# Patient Record
Sex: Male | Born: 1955 | Race: White | Hispanic: No | Marital: Married | Smoking: Former smoker
Health system: Southern US, Community
[De-identification: ages and names within clinical notes are randomized; demographics above are authoritative.]

## PROBLEM LIST (undated history)

## (undated) DIAGNOSIS — I10 Essential (primary) hypertension: Secondary | ICD-10-CM

## (undated) DIAGNOSIS — M109 Gout, unspecified: Secondary | ICD-10-CM

## (undated) DIAGNOSIS — M1990 Unspecified osteoarthritis, unspecified site: Secondary | ICD-10-CM

## (undated) DIAGNOSIS — Z87442 Personal history of urinary calculi: Secondary | ICD-10-CM

## (undated) DIAGNOSIS — K219 Gastro-esophageal reflux disease without esophagitis: Secondary | ICD-10-CM

## (undated) HISTORY — PX: ELBOW SURGERY: SHX618

## (undated) HISTORY — PX: ESOPHAGOGASTRODUODENOSCOPY: SHX1529

---

## 2009-05-05 ENCOUNTER — Ambulatory Visit: Payer: Self-pay | Admitting: Surgery

## 2013-01-06 ENCOUNTER — Ambulatory Visit: Payer: Self-pay | Admitting: Unknown Physician Specialty

## 2013-12-08 ENCOUNTER — Ambulatory Visit: Payer: Self-pay | Admitting: General Practice

## 2014-01-25 ENCOUNTER — Ambulatory Visit: Payer: Self-pay | Admitting: Surgery

## 2014-05-08 NOTE — Op Note (Signed)
PATIENT NAME:  [name], [name] MR#:  [id number] DATE OF BIRTH:  March 09, 1955  DATE OF PROCEDURE:  01/25/2014  PREOPERATIVE DIAGNOSIS: Degenerative joint disease with loose body, right elbow.   POSTOPERATIVE DIAGNOSIS:   Degenerative joint disease with loose body, right elbow.   PROCEDURE:   Extensive arthroscopic debridement with excision of multiple osteophytes and excision of loose body, right elbow.   SURGEON:  Maryagnes Amos, M.D.   ASSISTANT:  Devota Pace, NP.  ANESTHESIA:  General endotracheal.   FINDINGS: As noted above.   COMPLICATIONS: None.   ESTIMATED BLOOD LOSS: Minimal.   TOTAL FLUIDS: 800 mL of crystalloid.   TOURNIQUET TIME: 105 minutes at 250 mmHg.   DRAINS: None.   CLOSURE:  4-0 Prolene interrupted sutures.   BRIEF CLINICAL NOTE:  The patient is a 59 year old male with a long history of gradually  progressive pain and limited motion of his right elbow. His symptoms have gradually progressed despite medications, activity modification, steroid injection, etc.  His history and examination were consistent with significant degenerative joint disease of the elbow as confirmed by plain radiographs. A CT scan demonstrated the presence of at least one large loose body in the posterior fossa. He presents at this time for arthroscopy, debridement of osteophytes, and removal of a loose body.   PROCEDURE: The patient was brought into the Operating Room and lain in the supine position. After adequate general endotracheal intubation and anesthesia were obtained, the patient was repositioned in the left lateral decubitus position and secured using a beanbag. The right arm was draped over the arm holder and attached to the arm holder with Coban after applying the tourniquet. The right upper extremity was prepped with ChloraPrep solution before being draped sterilely. Preoperative antibiotics were administered. The elbow was carefully palpated and the appropriate portal sites marked  before the joint was inflated with approximately 25 mL of saline solution. The limb was exsanguinated with an Esmarch and the tourniquet inflated to 250 mmHg.   The anterolateral portal was created first just over the radial head. The cannula was advanced across the joint and punched out medially so that it tented the skin. A small stab incision was made here for the anteromedial portal. The disposable cannula was then guided into the joint over the initial cannula. The scope was inserted.  Extensive synovitis was debrided using the full radius resector before the ArthroCare wand was used to denude the periosteal tissue off the large osteophyte noted in the coronoid notch.  This large osteophyte was removed using the 4 mm round bur.   Once this was adequately resected, attention was directed to the coronoid process. This was denuded of soft tissues using the full radius resector ArthroCare wand before approximately 5 mm of the tip was removed using the round bur. As this was being resected, it was noted that there appeared to be a loose fragment, consistent with a symptomatic osteophyte. This was removed in its entirety. Once this was resected, the full radius resector was reintroduced to remove any residual bony debris. Using a switching stick, the camera was repositioned in the anteromedial portal and the instrumentation performed through the anterolateral portal. The full radius resector was used to remove additional synovial tissues to better expose the radial head and lateral portion of the joint. The ArthroCare wand was used to expose and delineate the spur over the anterior aspect of the humeral head in the radial fossa. The spur was removed using the 4 mm bur.  In  addition, this was extended to the midline further so as to be sure that the cornoid fossa was adequately debrided.  In addition, a little more of the coronoid process was resected as well. Again, the full radius resector was used to remove any  residual bony debris as well as to take down some thickened capsular tissue off the anterior aspect of the distal humerus, as well as along the anterior aspect of the coronoid process and the radial neck region. Care was taken to avoid debriding any of the annular ligaments. The instruments were then removed from the anterior aspect of the elbow after suctioning the excess fluid.   The posterior aspect of the elbow was addressed next. A posterolateral portal was created for placement of the arthroscope.  A posterior central portal also was created for instrumentation. The full radius resector was inserted and used to debride synovial tissues in order to improve visualization. This also was used to better define the olecranon tip, as well as the olecranon fossa.  A large osteophyte was noted in the olecranon fossa. This was better delineated using the ArthroCare wand before the 4 mm bur was used to remove the spur. Care was taken to remove some posterolateral osteophytes as well. The tip of the olecranon was then resected.  Of note, an approximately 5 x 8 mm loose body was identified in the posterolateral aspect of the elbow and removed using graspers.  Once adequate bony resection had been achieved, additional soft tissues were debrided using the full radius resector with care taken when debriding posterior medially.  A more extensive capsulectomy was performed in the posterior aspect of the distal humerus centrally and laterally, as well as further debridement of the posterolateral gutter region. The ArthroCare wand also was used to remove some thickened synovial and capsular tissues.   The instruments were removed from the posterior aspect of the elbow after suctioning the excess fluid. The portal sites were reapproximated using 4-0 Prolene interrupted sutures before a bulky dressing was applied to the elbow and the arm placed into a sling.   The patient was then rolled back in the supine position before he  was awakened, extubated, and returned to the recovery room in satisfactory condition after tolerating the procedure well.     ____________________________ J. Derald MacleodJeffrey Darivs Lunden, MD jjp:at D: 01/25/2014 12:16:33 ET T: 01/25/2014 12:45:38 ET JOB#: [id number]  cc: Maryagnes AmosJ. Jeffrey Mujtaba Bollig, MD, <Dictator> JEFF Fidel LevyJ Jaelen Gellerman MD ELECTRONICALLY SIGNED 01/25/2014 15:03

## 2016-01-15 ENCOUNTER — Encounter
Admission: RE | Admit: 2016-01-15 | Discharge: 2016-01-15 | Disposition: A | Discharge status: Home / Self Care | Payer: BLUE CROSS/BLUE SHIELD | Source: Ambulatory Visit | Attending: Surgery | Admitting: Surgery

## 2016-01-15 HISTORY — DX: Unspecified osteoarthritis, unspecified site: M19.90

## 2016-01-15 HISTORY — DX: Personal history of urinary calculi: Z87.442

## 2016-01-15 HISTORY — DX: Gastro-esophageal reflux disease without esophagitis: K21.9

## 2016-01-15 HISTORY — DX: Gout, unspecified: M10.9

## 2016-01-15 HISTORY — DX: Essential (primary) hypertension: I10

## 2016-01-15 NOTE — Patient Instructions (Signed)
  Your procedure is scheduled on: 01-23-16 Report to Same Day Surgery 2nd floor medical mall [facility](Medical Mall Entrance-take elevator on left to 2nd floor.  Check in with surgery information desk.) To find out your arrival time please call [phone] between 1PM - 3PM on 01-22-16  Remember: Instructions that are not followed completely may result in serious medical risk, up to and including death, or upon the discretion of your surgeon and anesthesiologist your surgery may need to be rescheduled.    _x___ 1. Do not eat food or drink liquids after midnight. No gum chewing or hard candies.     __x__ 2. No Alcohol for 24 hours before or after surgery.   __x__3. No Smoking for 24 prior to surgery.   ____  4. Bring all medications with you on the day of surgery if instructed.    __x__ 5. Notify your doctor if there is any change in your medical condition     (cold, fever, infections).     Do not wear jewelry, make-up, hairpins, clips or nail polish.  Do not wear lotions, powders, or perfumes. You may wear deodorant.  Do not shave 48 hours prior to surgery. Men may shave face and neck.  Do not bring valuables to the hospital.    [facility] is not [facility] for any belongings or valuables.               Contacts, dentures or bridgework may not be worn into surgery.  Leave your suitcase in the car. After surgery it may be brought to your room.  For patients admitted to the hospital, discharge time is determined by your treatment team.   Patients discharged the day of surgery will not be allowed to drive home.  You will need someone to drive you home and stay with you the night of your procedure.    Please read over the following fact sheets that you were given:   [facility] Preparing for [facility] and or MRSA Information   ____ Take these medicines the morning of surgery with A SIP OF WATER:    1. NONE  2.  3.  4.  5.  6.  ____Fleets enema or Magnesium Citrate as directed.   ____  Use CHG Soap or sage wipes as directed on instruction sheet   ____ Use inhalers on the day of surgery and bring to hospital day of surgery  ____ Stop metformin 2 days prior to surgery    ____ Take 1/2 of usual insulin dose the night before surgery and none on the morning of surgery.   ____ Stop Aspirin, Coumadin, Pllavix ,Eliquis, Effient, or Pradaxa  x__ Stop Anti-inflammatories such as Advil, Aleve, Ibuprofen, Motrin, Naproxen,          Naprosyn, Goodies powders or aspirin products NOW-Ok to take Tylenol.   ____ Stop supplements until after surgery.    ____ Bring C-Pap to the hospital.

## 2016-02-01 MED ORDER — CEFAZOLIN SODIUM-DEXTROSE 2-4 GM/100ML-% IV SOLN
2.0000 g | Freq: Once | INTRAVENOUS | Status: AC
  Administered 2016-02-02 (×2): 2 g via INTRAVENOUS

## 2016-02-02 ENCOUNTER — Ambulatory Visit: Payer: BLUE CROSS/BLUE SHIELD | Admitting: Anesthesiology

## 2016-02-02 ENCOUNTER — Encounter
Admission: RE | Disposition: A | Discharge status: Home / Self Care | Payer: Self-pay | Source: Ambulatory Visit | Attending: Surgery

## 2016-02-02 ENCOUNTER — Encounter: Payer: Self-pay | Admitting: *Deleted

## 2016-02-02 ENCOUNTER — Ambulatory Visit
Admission: RE | Admit: 2016-02-02 | Discharge: 2016-02-02 | Disposition: A | Discharge status: Home / Self Care | Payer: BLUE CROSS/BLUE SHIELD | Source: Ambulatory Visit | Attending: Surgery | Admitting: Surgery

## 2016-02-02 DIAGNOSIS — M1990 Unspecified osteoarthritis, unspecified site: Secondary | ICD-10-CM | POA: Insufficient documentation

## 2016-02-02 DIAGNOSIS — Z87442 Personal history of urinary calculi: Secondary | ICD-10-CM | POA: Diagnosis not present

## 2016-02-02 DIAGNOSIS — K219 Gastro-esophageal reflux disease without esophagitis: Secondary | ICD-10-CM | POA: Diagnosis not present

## 2016-02-02 DIAGNOSIS — M109 Gout, unspecified: Secondary | ICD-10-CM | POA: Insufficient documentation

## 2016-02-02 DIAGNOSIS — Z79899 Other long term (current) drug therapy: Secondary | ICD-10-CM | POA: Diagnosis not present

## 2016-02-02 DIAGNOSIS — I10 Essential (primary) hypertension: Secondary | ICD-10-CM | POA: Diagnosis not present

## 2016-02-02 DIAGNOSIS — Z87891 Personal history of nicotine dependence: Secondary | ICD-10-CM | POA: Insufficient documentation

## 2016-02-02 DIAGNOSIS — K439 Ventral hernia without obstruction or gangrene: Secondary | ICD-10-CM | POA: Insufficient documentation

## 2016-02-02 DIAGNOSIS — Z9889 Other specified postprocedural states: Secondary | ICD-10-CM | POA: Insufficient documentation

## 2016-02-02 HISTORY — PX: VENTRAL HERNIA REPAIR: SHX424

## 2016-02-02 SURGERY — REPAIR, HERNIA, VENTRAL
Anesthesia: General | Wound class: Clean Contaminated

## 2016-02-02 MED ORDER — SUCCINYLCHOLINE CHLORIDE 200 MG/10ML IV SOSY
PREFILLED_SYRINGE | INTRAVENOUS | Status: AC
  Filled 2016-02-02: qty 10

## 2016-02-02 MED ORDER — ONDANSETRON HCL 4 MG/2ML IJ SOLN
INTRAMUSCULAR | Status: AC
  Filled 2016-02-02: qty 2

## 2016-02-02 MED ORDER — LACTATED RINGERS IV SOLN
INTRAVENOUS | Status: DC
  Administered 2016-02-02: 08:00:00 via INTRAVENOUS

## 2016-02-02 MED ORDER — SEVOFLURANE IN SOLN
RESPIRATORY_TRACT | Status: AC
  Filled 2016-02-02: qty 250

## 2016-02-02 MED ORDER — BUPIVACAINE HCL (PF) 0.5 % IJ SOLN
INTRAMUSCULAR | Status: AC
  Filled 2016-02-02: qty 30

## 2016-02-02 MED ORDER — FAMOTIDINE 20 MG PO TABS
ORAL_TABLET | ORAL | Status: DC
Start: 2016-02-02 — End: 2016-02-02
  Filled 2016-02-02: qty 1

## 2016-02-02 MED ORDER — ROCURONIUM BROMIDE 100 MG/10ML IV SOLN
INTRAVENOUS | Status: DC | PRN
  Administered 2016-02-02: 50 mg via INTRAVENOUS

## 2016-02-02 MED ORDER — ROCURONIUM BROMIDE 50 MG/5ML IV SOSY
PREFILLED_SYRINGE | INTRAVENOUS | Status: AC
  Filled 2016-02-02: qty 5

## 2016-02-02 MED ORDER — DEXMEDETOMIDINE HCL IN NACL 200 MCG/50ML IV SOLN
INTRAVENOUS | Status: DC | PRN
  Administered 2016-02-02: 12 ug via INTRAVENOUS

## 2016-02-02 MED ORDER — EPINEPHRINE PF 1 MG/ML IJ SOLN
INTRAMUSCULAR | Status: AC
Start: 2016-02-02 — End: 2016-02-02
  Filled 2016-02-02: qty 1

## 2016-02-02 MED ORDER — OXYCODONE HCL 5 MG/5ML PO SOLN: 5.0000 mg | Freq: Once | ORAL | Status: DC | PRN

## 2016-02-02 MED ORDER — SUGAMMADEX SODIUM 200 MG/2ML IV SOLN
INTRAVENOUS | Status: DC | PRN
  Administered 2016-02-02: 200 mg via INTRAVENOUS

## 2016-02-02 MED ORDER — HYDROMORPHONE HCL 1 MG/ML IJ SOLN
INTRAMUSCULAR | Status: DC | PRN
  Administered 2016-02-02: 0.5 mg via INTRAVENOUS

## 2016-02-02 MED ORDER — HYDROCODONE-ACETAMINOPHEN 5-325 MG PO TABS: 1.0000 | ORAL_TABLET | ORAL | Status: DC | PRN

## 2016-02-02 MED ORDER — DEXMEDETOMIDINE HCL IN NACL 200 MCG/50ML IV SOLN
INTRAVENOUS | Status: AC
  Filled 2016-02-02: qty 50

## 2016-02-02 MED ORDER — ONDANSETRON HCL 4 MG/2ML IJ SOLN
INTRAMUSCULAR | Status: DC | PRN
  Administered 2016-02-02: 4 mg via INTRAVENOUS

## 2016-02-02 MED ORDER — OXYCODONE HCL 5 MG PO TABS: 5.0000 mg | ORAL_TABLET | Freq: Once | ORAL | Status: DC | PRN

## 2016-02-02 MED ORDER — MIDAZOLAM HCL 2 MG/2ML IJ SOLN
INTRAMUSCULAR | Status: AC
  Filled 2016-02-02: qty 2

## 2016-02-02 MED ORDER — SUGAMMADEX SODIUM 200 MG/2ML IV SOLN
INTRAVENOUS | Status: AC
  Filled 2016-02-02: qty 2

## 2016-02-02 MED ORDER — PROPOFOL 10 MG/ML IV BOLUS
INTRAVENOUS | Status: AC
  Filled 2016-02-02: qty 20

## 2016-02-02 MED ORDER — LIDOCAINE HCL (CARDIAC) 20 MG/ML IV SOLN
INTRAVENOUS | Status: DC | PRN
  Administered 2016-02-02: 100 mg via INTRAVENOUS

## 2016-02-02 MED ORDER — DEXAMETHASONE SODIUM PHOSPHATE 10 MG/ML IJ SOLN
INTRAMUSCULAR | Status: DC | PRN
  Administered 2016-02-02: 10 mg via INTRAVENOUS

## 2016-02-02 MED ORDER — PROPOFOL 10 MG/ML IV BOLUS
INTRAVENOUS | Status: DC | PRN
  Administered 2016-02-02: 180 mg via INTRAVENOUS

## 2016-02-02 MED ORDER — KETOROLAC TROMETHAMINE 30 MG/ML IJ SOLN
INTRAMUSCULAR | Status: AC
  Filled 2016-02-02: qty 1

## 2016-02-02 MED ORDER — CEFAZOLIN SODIUM-DEXTROSE 2-4 GM/100ML-% IV SOLN
INTRAVENOUS | Status: AC
  Filled 2016-02-02: qty 100

## 2016-02-02 MED ORDER — HYDROMORPHONE HCL 1 MG/ML IJ SOLN
INTRAMUSCULAR | Status: AC
  Filled 2016-02-02: qty 1

## 2016-02-02 MED ORDER — FENTANYL CITRATE (PF) 100 MCG/2ML IJ SOLN
INTRAMUSCULAR | Status: DC | PRN
  Administered 2016-02-02: 100 ug via INTRAVENOUS

## 2016-02-02 MED ORDER — FENTANYL CITRATE (PF) 100 MCG/2ML IJ SOLN: 25.0000 ug | INTRAMUSCULAR | Status: DC | PRN

## 2016-02-02 MED ORDER — FAMOTIDINE 20 MG PO TABS
20.0000 mg | ORAL_TABLET | Freq: Once | ORAL | Status: AC
  Administered 2016-02-02: 20 mg via ORAL

## 2016-02-02 MED ORDER — FENTANYL CITRATE (PF) 250 MCG/5ML IJ SOLN
INTRAMUSCULAR | Status: AC
  Filled 2016-02-02: qty 5

## 2016-02-02 MED ORDER — HYDROCODONE-ACETAMINOPHEN 5-325 MG PO TABS: 1.0000 | ORAL_TABLET | ORAL | 0 refills | Status: AC | PRN

## 2016-02-02 MED ORDER — DEXAMETHASONE SODIUM PHOSPHATE 10 MG/ML IJ SOLN
INTRAMUSCULAR | Status: AC
Start: 2016-02-02 — End: 2016-02-02
  Filled 2016-02-02: qty 1

## 2016-02-02 MED ORDER — MIDAZOLAM HCL 2 MG/2ML IJ SOLN
INTRAMUSCULAR | Status: DC | PRN
  Administered 2016-02-02: 2 mg via INTRAVENOUS

## 2016-02-02 MED ORDER — BUPIVACAINE-EPINEPHRINE (PF) 0.5% -1:200000 IJ SOLN
INTRAMUSCULAR | Status: DC | PRN
  Administered 2016-02-02: 12 mL via PERINEURAL

## 2016-02-02 SURGICAL SUPPLY — 31 items
BLADE CLIPPER SURG (BLADE) ×3 IMPLANT
CANISTER SUCT 1200ML W/VALVE (MISCELLANEOUS) ×3 IMPLANT
CHLORAPREP W/TINT 26ML (MISCELLANEOUS) ×3 IMPLANT
DERMABOND ADVANCED (GAUZE/BANDAGES/DRESSINGS) ×2
DERMABOND ADVANCED .7 DNX12 (GAUZE/BANDAGES/DRESSINGS) ×1 IMPLANT
DRAPE LAPAROTOMY 100X77 ABD (DRAPES) ×3 IMPLANT
ELECT REM PT RETURN 9FT ADLT (ELECTROSURGICAL) ×3
ELECTRODE REM PT RTRN 9FT ADLT (ELECTROSURGICAL) ×1 IMPLANT
GAUZE SPONGE 4X4 12PLY STRL (GAUZE/BANDAGES/DRESSINGS) IMPLANT
GLOVE BIO SURGEON STRL SZ 6 (GLOVE) ×6 IMPLANT
GLOVE BIO SURGEON STRL SZ7.5 (GLOVE) ×3 IMPLANT
GLOVE INDICATOR 6.5 STRL GRN (GLOVE) ×6 IMPLANT
GOWN STRL REUS W/ TWL LRG LVL3 (GOWN DISPOSABLE) ×4 IMPLANT
GOWN STRL REUS W/TWL LRG LVL3 (GOWN DISPOSABLE) ×8
KIT RM TURNOVER STRD PROC AR (KITS) ×3 IMPLANT
LABEL OR SOLS (LABEL) ×3 IMPLANT
MESH SYNTHETIC 4X6 SOFT BARD (Mesh General) ×1 IMPLANT
MESH SYNTHETIC SOFT BARD 4X6 (Mesh General) ×2 IMPLANT
NEEDLE FILTER BLUNT 18X 1/2SAF (NEEDLE) ×2
NEEDLE FILTER BLUNT 18X1 1/2 (NEEDLE) ×1 IMPLANT
NEEDLE HYPO 25X1 1.5 SAFETY (NEEDLE) ×3 IMPLANT
NS IRRIG 500ML POUR BTL (IV SOLUTION) ×3 IMPLANT
PACK BASIN MINOR ARMC (MISCELLANEOUS) ×3 IMPLANT
STAPLER SKIN PROX 35W (STAPLE) IMPLANT
SUT CHROMIC 3 0 SH 27 (SUTURE) ×3 IMPLANT
SUT MNCRL 4-0 (SUTURE) ×2
SUT MNCRL 4-0 27XMFL (SUTURE) ×1
SUT SURGILON 0 30 BLK (SUTURE) ×9 IMPLANT
SUTURE MNCRL 4-0 27XMF (SUTURE) ×1 IMPLANT
SYR TB 1ML 27GX1/2 LL (SYRINGE) ×3 IMPLANT
SYRINGE 10CC LL (SYRINGE) ×3 IMPLANT

## 2016-02-02 NOTE — H&P (Signed)
Jonathan ReaderCharles T Oconnell is an 61 y.o. male.   Chief Complaint: A bulge in the abdomen HPI: He reports a history of bulging in the epigastrium which dates back to childhood which in recent years has slowly increased in size. He has a minimal amount of discomfort at the site. He was seen and evaluated in the office and found to have a ventral hernia and repair was recommended for definitive treatment.    Past Medical History:  Diagnosis Date  . Arthritis   . GERD (gastroesophageal reflux disease)    occ-no meds  . Gout   . History of kidney stones   . Hypertension    h/o-pcp took pt off bp med in 2015-pt had lost weight and bp has remained under control    Past Surgical History:  Procedure Laterality Date  . ELBOW SURGERY Right   . ESOPHAGOGASTRODUODENOSCOPY      History reviewed. No pertinent family history. Social History:  reports that he quit smoking about 31 years ago. His smoking use included Cigarettes. He has a 10.00 pack-year smoking history. He has quit using smokeless tobacco. His smokeless tobacco use included Chew. He reports that he drinks alcohol. He reports that he does not use drugs.  Allergies: No Known Allergies  Medications Prior to Admission  Medication Sig Dispense Refill  . acetaminophen (TYLENOL) 325 MG tablet Take 650 mg by mouth every 6 (six) hours as needed (for pain.).    Marland Kitchen. allopurinol (ZYLOPRIM) 100 MG tablet Take 200 mg by mouth at bedtime.    Marland Kitchen. ibuprofen (ADVIL,MOTRIN) 200 MG tablet Take 200 mg by mouth every 6 (six) hours as needed.      No results found for this or any previous visit (from the past 48 hour(s)). No results found.  ROS he reports that about 2 weeks ago had a cough and cold which has resolved. He reports no more recent difficulty breathing. He reports no chest pains. He has been eating satisfactorily and moving his bowels satisfactorily and voiding satisfactorily. Review of systems otherwise negative.  Blood pressure 128/81, pulse 66,  temperature 97.8 F (36.6 C), temperature source Oral, resp. rate 16, weight 76.2 kg (168 lb), SpO2 100 %. Physical Exam  GENERAL:  Awake alert and oriented and in no acute distress.  HEENT:  Head is normocephalic.  Pupils are equal reactive to light.  Extraocular movements are intact. Sclera is clear.  Pharynx is clear.  NECK:  Supple with no palpable mass and no adenopathy.  LUNGS:  Clear without rales rhonchi or wheezes.  HEART:  Regular rhythm S1-S2, without murmur.  ABDOMEN: Soft and flat. When he lifts his head up off the papilla there is a bulge in the epigastrium up proximally 6 cm in dimension and is easily reducible. There is minimal diastases recti.  NEUROLOGIC:  Awake alert and moving all extremities.  EXTREMITIES:  Well-developed well-nourished with no dependent edema.   Recent lab work reviewed  Assessment/Plan Ventral hernia.  I discussed the plan for ventral hernia repair. We'll give 2 g Kefzol prior to surgery.  Renda RollsWilton Smith, MD 02/02/2016, 8:51 AM

## 2016-02-02 NOTE — Anesthesia Postprocedure Evaluation (Signed)
Anesthesia Post Note  Patient: [name]  Procedure(s) Performed: Procedure(s) (LRB): HERNIA REPAIR VENTRAL ADULT (N/A)  Patient location during evaluation: Endoscopy Anesthesia Type: General Level of consciousness: awake and alert Pain management: pain level controlled Vital Signs Assessment: post-procedure vital signs reviewed and stable Respiratory status: spontaneous breathing, nonlabored ventilation, respiratory function stable and patient connected to nasal cannula oxygen Cardiovascular status: blood pressure returned to baseline and stable Postop Assessment: no signs of nausea or vomiting Anesthetic complications: no     Last Vitals:  Vitals:   02/02/16 1113 02/02/16 1138  BP: 109/74 125/79  Pulse: (!) 59 67  Resp: 19 18  Temp:  36.6 C    Last Pain:  Vitals:   02/02/16 1138  TempSrc: Oral  PainSc: 0-No pain                 Cleda MccreedyJoseph K Ayan Heffington

## 2016-02-02 NOTE — Anesthesia Procedure Notes (Signed)
Procedure Name: Intubation Date/Time: 02/02/2016 9:50 AM Performed by: [facility] Pre-anesthesia Checklist: Patient identified, Emergency Drugs available, Suction available and Patient being monitored Patient Re-evaluated:Patient Re-evaluated prior to inductionOxygen Delivery Method: Circle system utilized Preoxygenation: Pre-oxygenation with 100% oxygen Intubation Type: IV induction Ventilation: Mask ventilation without difficulty Laryngoscope Size: Mac and 3 Grade View: Grade II Tube type: Oral Tube size: 7.0 mm Number of attempts: 1 Airway Equipment and Method: Stylet and Patient positioned with wedge pillow Placement Confirmation: ETT inserted through vocal cords under direct vision,  positive ETCO2 and breath sounds checked- equal and bilateral Secured at: 21 cm Tube secured with: Tape Dental Injury: Teeth and Oropharynx as per pre-operative assessment

## 2016-02-02 NOTE — Anesthesia Preprocedure Evaluation (Signed)
Anesthesia Evaluation  Patient identified by MRN, date of birth, ID band Patient awake    Reviewed: Allergy & Precautions, H&P , NPO status , Patient's Chart, lab work & pertinent test results  History of Anesthesia Complications Negative for: history of anesthetic complications  Airway Mallampati: II  TM Distance: >3 FB Neck ROM: full    Dental no notable dental hx. (+) Poor Dentition, Chipped, Caps   Pulmonary neg shortness of breath, former smoker,    Pulmonary exam normal breath sounds clear to auscultation   (-) intubated    Cardiovascular Exercise Tolerance: Good hypertension, (-) angina(-) Past MI and (-) DOE Normal cardiovascular exam Rhythm:regular Rate:Normal     Neuro/Psych negative neurological ROS  negative psych ROS   GI/Hepatic Neg liver ROS, GERD  Controlled and Medicated,  Endo/Other  negative endocrine ROS  Renal/GU      Musculoskeletal  (+) Arthritis ,   Abdominal   Peds  Hematology negative hematology ROS (+)   Anesthesia Other Findings Signs and symptoms suggestive of sleep apnea   Past Medical History: No date: Arthritis No date: GERD (gastroesophageal reflux disease)     Comment: occ-no meds No date: Gout No date: History of kidney stones No date: Hypertension     Comment: h/o-pcp took pt off bp med in 2015-pt had lost              weight and bp has remained under control  Past Surgical History: No date: ELBOW SURGERY Right No date: ESOPHAGOGASTRODUODENOSCOPY     Reproductive/Obstetrics negative OB ROS                             Anesthesia Physical Anesthesia Plan  ASA: III  Anesthesia Plan: General ETT   Post-op Pain Management:    Induction:   Airway Management Planned:   Additional Equipment:   Intra-op Plan:   Post-operative Plan:   Informed Consent: I have reviewed the patients History and Physical, chart, labs and discussed the  procedure including the risks, benefits and alternatives for the proposed anesthesia with the patient or authorized representative who has indicated his/her understanding and acceptance.   Dental Advisory Given  Plan Discussed with: Anesthesiologist, CRNA and Surgeon  Anesthesia Plan Comments:         Anesthesia Quick Evaluation

## 2016-02-02 NOTE — Anesthesia Post-op Follow-up Note (Cosign Needed)
Anesthesia QCDR form completed.        

## 2016-02-02 NOTE — Transfer of Care (Signed)
Immediate Anesthesia Transfer of Care Note  Patient: [name]  Procedure(s) Performed: Procedure(s): HERNIA REPAIR VENTRAL ADULT (N/A)  Patient Location: PACU  Anesthesia Type:General  Level of Consciousness: sedated  Airway & Oxygen Therapy: Patient Spontanous Breathing and Patient connected to face mask oxygen  Post-op Assessment: Report given to RN and Post -op Vital signs reviewed and stable  Post vital signs: Reviewed and stable  Last Vitals:  Vitals:   02/02/16 0747 02/02/16 1058  BP: 128/81 123/69  Pulse: 66 61  Resp: 16 (!) 24  Temp: 36.6 C 36.4 C    Last Pain:  Vitals:   02/02/16 1058  TempSrc:   PainSc: (P) Asleep         Complications: No apparent anesthesia complications

## 2016-02-02 NOTE — Op Note (Signed)
OPERATIVE REPORT  PREOPERATIVE  DIAGNOSIS: . Ventral hernia  POSTOPERATIVE DIAGNOSIS: . Ventral hernia  PROCEDURE: . Ventral hernia repair  ANESTHESIA:  General  SURGEON: Renda RollsWilton Jayquon Theiler  MD   INDICATIONS: . He reports a history of bulging in the epigastrium dating back to childhood. Recently this is gradually increased in size with minimal discomfort and a ventral hernia was demonstrated on physical exam. Surgery was recommended for definitive treatment.  With the patient on the operating table in the supine position he was placed under general endotracheal anesthesia. The epigastrium was clipped and prepared with ChloraPrep and draped in a sterile manner.  An abdominal midline incision was made approximately 5 cm in length carried down through subcutaneous tissues. Several small bleeding points were cauterized. There was a fatty mass within the tissues which was dissected free from surrounding structures and this mass was multilobulated and approximally 5 cm in dimension. It was separated from the fascial ring defect and reduced back into the abdominal cavity. The properitoneal fat was dissected away from the fascia circumferentially. Bard soft mesh was cut to create an oval shape of 2 x 3 cm and was placed into the properitoneal plane oriented transversely and sutured to the overlying fascia with through and through 0 Surgilon sutures. Next the fascial ring defect was closed with a transversely oriented suture line of interrupted 0 Surgilon figure-of-eight sutures incorporating each suture into the mesh. The subcutaneous tissues and deep fascia were infiltrated with half percent Sensorcaine with epinephrine. Subcutaneous tissues were closed with interrupted 3-0 chromic inverted sutures. The skin was closed with running 4-0 Monocryl subcuticular suture and Dermabond.  The patient tolerated surgery satisfactorily and was then prepared for transfer to the recovery room  [facility] CenterWilton Diahann Guajardo M.D.

## 2016-02-02 NOTE — Discharge Instructions (Addendum)
Take Tylenol or Norco if needed for pain. ° °Should not drive or do anything dangerous when taking Norco. ° °May shower and blot dry. ° °Avoid straining and heavy lifting. ° °AMBULATORY SURGERY  °DISCHARGE INSTRUCTIONS ° ° °1) The drugs that you were given will stay in your system until tomorrow so for the next 24 hours you should not: ° °A) Drive an automobile °B) Make any legal decisions °C) Drink any alcoholic beverage ° ° °2) You may resume regular meals tomorrow.  Today it is better to start with liquids and gradually work up to solid foods. ° °You may eat anything you prefer, but it is better to start with liquids, then soup and crackers, and gradually work up to solid foods. ° ° °3) Please notify your doctor immediately if you have any unusual bleeding, trouble breathing, redness and pain at the surgery site, drainage, fever, or pain not relieved by medication. ° °4) Additional Instructions: ° ° °Please contact your physician with any problems or Same Day Surgery at [phone], Monday through Friday 6 am to 4 pm, or [place] [facility] [place] Main number at [phone]. °

## 2016-08-29 ENCOUNTER — Ambulatory Visit
Admission: RE | Admit: 2016-08-29 | Discharge: 2016-08-29 | Disposition: A | Discharge status: Home / Self Care | Payer: BLUE CROSS/BLUE SHIELD | Source: Ambulatory Visit | Attending: Internal Medicine | Admitting: Internal Medicine

## 2016-08-29 ENCOUNTER — Other Ambulatory Visit: Payer: Self-pay | Admitting: Internal Medicine

## 2016-08-29 DIAGNOSIS — R1031 Right lower quadrant pain: Secondary | ICD-10-CM | POA: Diagnosis not present

## 2016-08-29 MED ORDER — IOPAMIDOL (ISOVUE-300) INJECTION 61%
100.0000 mL | Freq: Once | INTRAVENOUS | Status: AC | PRN
  Administered 2016-08-29: 100 mL via INTRAVENOUS

## 2018-02-04 ENCOUNTER — Other Ambulatory Visit: Payer: Self-pay | Admitting: Student

## 2018-02-04 DIAGNOSIS — R1033 Periumbilical pain: Secondary | ICD-10-CM

## 2018-02-04 DIAGNOSIS — Z9889 Other specified postprocedural states: Secondary | ICD-10-CM

## 2018-02-04 DIAGNOSIS — Z8719 Personal history of other diseases of the digestive system: Secondary | ICD-10-CM

## 2018-02-18 ENCOUNTER — Ambulatory Visit
Admission: RE | Admit: 2018-02-18 | Discharge: 2018-02-18 | Disposition: A | Discharge status: Home / Self Care | Payer: BLUE CROSS/BLUE SHIELD | Source: Ambulatory Visit | Attending: Student | Admitting: Student

## 2018-02-18 DIAGNOSIS — Z9889 Other specified postprocedural states: Secondary | ICD-10-CM | POA: Diagnosis present

## 2018-02-18 DIAGNOSIS — Z8719 Personal history of other diseases of the digestive system: Secondary | ICD-10-CM | POA: Insufficient documentation

## 2018-02-18 DIAGNOSIS — R1033 Periumbilical pain: Secondary | ICD-10-CM | POA: Diagnosis present

## 2018-02-18 LAB — POCT I-STAT CREATININE: Creatinine, Ser: 0.9 mg/dL (ref 0.61–1.24)

## 2018-02-18 MED ORDER — IOHEXOL 300 MG/ML  SOLN
100.0000 mL | Freq: Once | INTRAMUSCULAR | Status: AC | PRN
  Administered 2018-02-18: 100 mL via INTRAVENOUS

## 2020-08-15 IMAGING — CT CT ABD-PELV W/ CM
2 of 5 series · 16 of 46 positions shown, 18 images · IV contrast (APPLIED)
Comparison: CT 08/29/2016

CLINICAL DATA: Upper abdominal pain for 1 week.

EXAM:
CT ABDOMEN AND PELVIS WITH CONTRAST
TECHNIQUE: Multidetector CT imaging of the abdomen and pelvis was performed
using the standard protocol following bolus administration of
intravenous contrast.
CONTRAST:  100mL OMNIPAQUE IOHEXOL 300 MG/ML  SOLN

[Series 2: routine abd/pel with · axial · 0.74mm/px · z∈[-989,-584]mm · 13 of 93 slices shown, 15 images]
[im 6/93  soft-tissue]
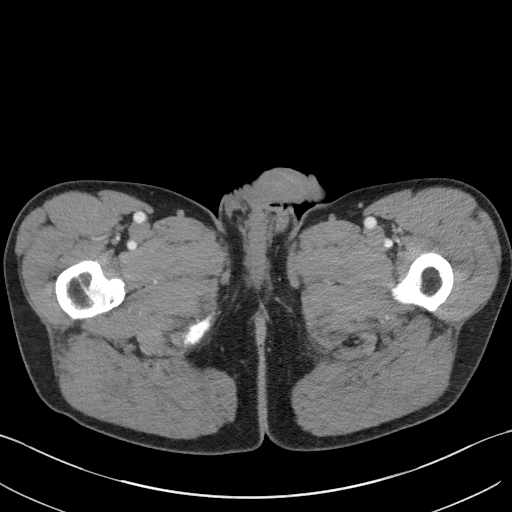
[im 6/93  bone]
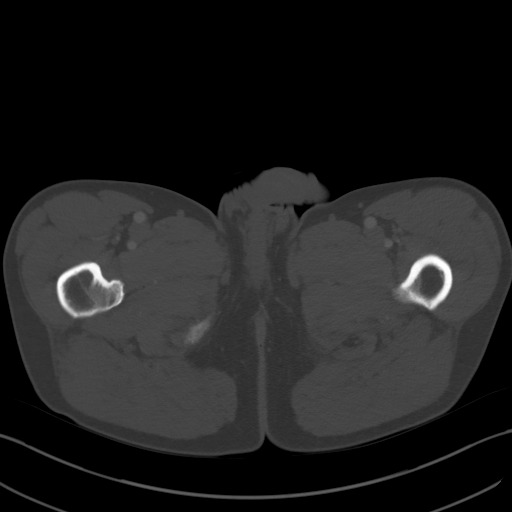
[im 11/93  soft-tissue]
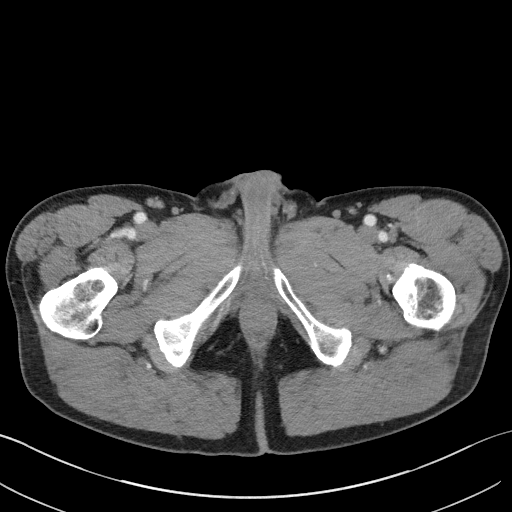
[im 21/93  soft-tissue]
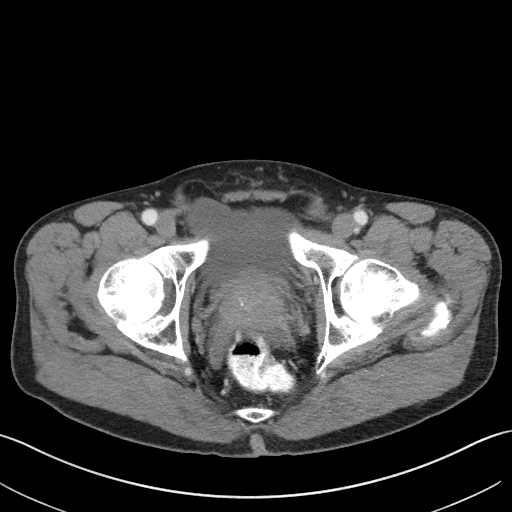
[im 26/93  soft-tissue]
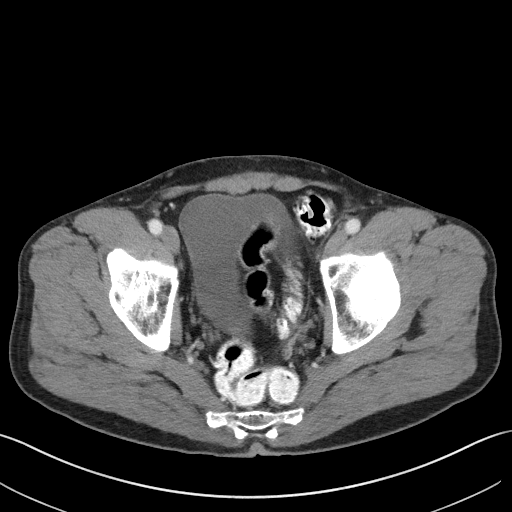
[im 31/93  soft-tissue]
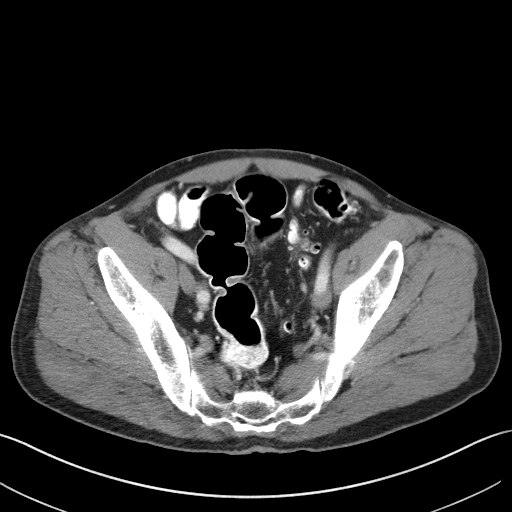
[im 41/93  soft-tissue]
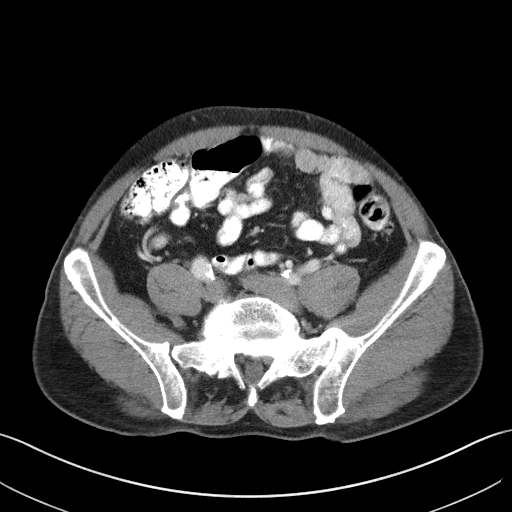
[im 47/93  soft-tissue]
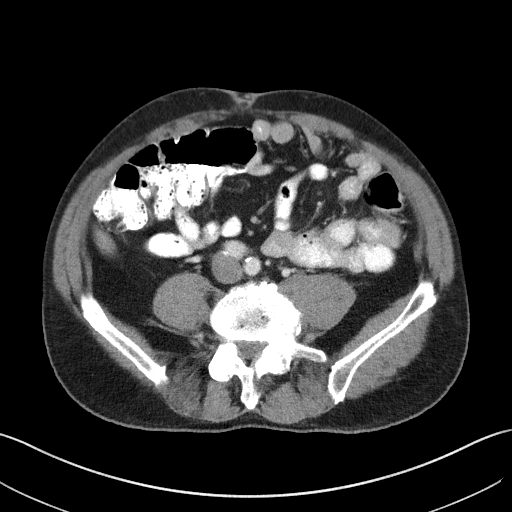
[im 52/93  soft-tissue]
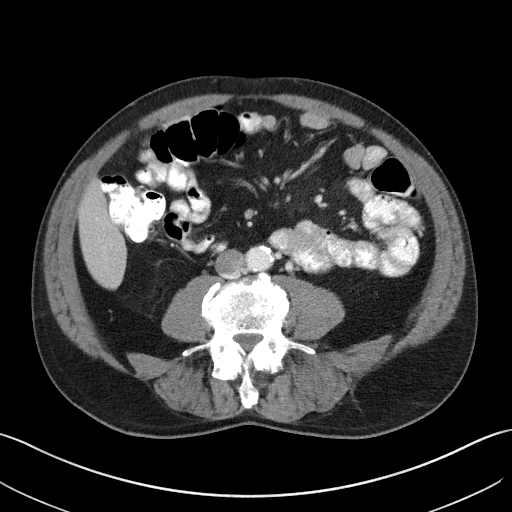
[im 62/93  soft-tissue]
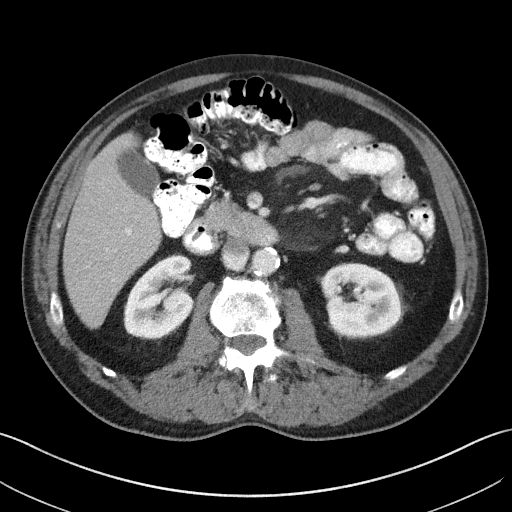
[im 62/93  bone]
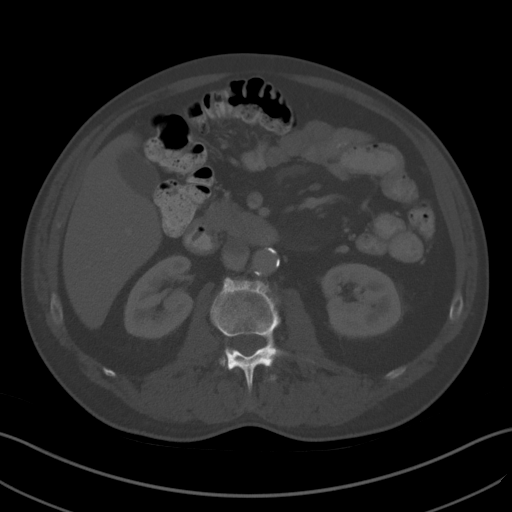
[im 67/93  soft-tissue]
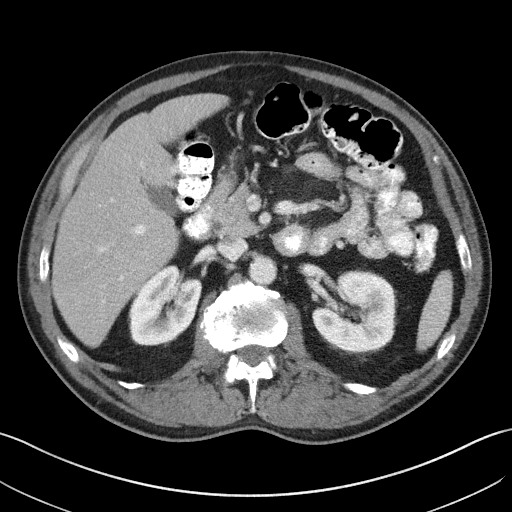
[im 72/93  soft-tissue]
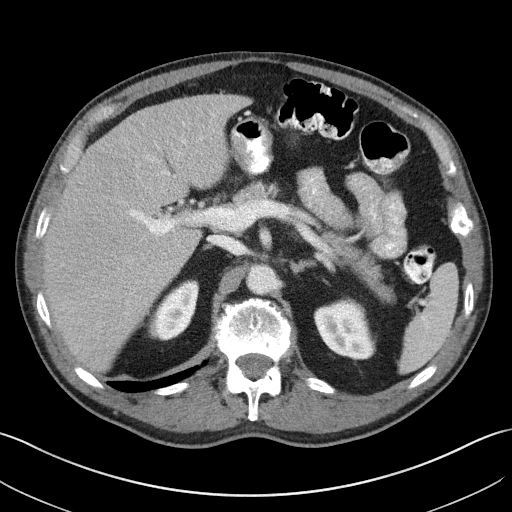
[im 82/93  soft-tissue]
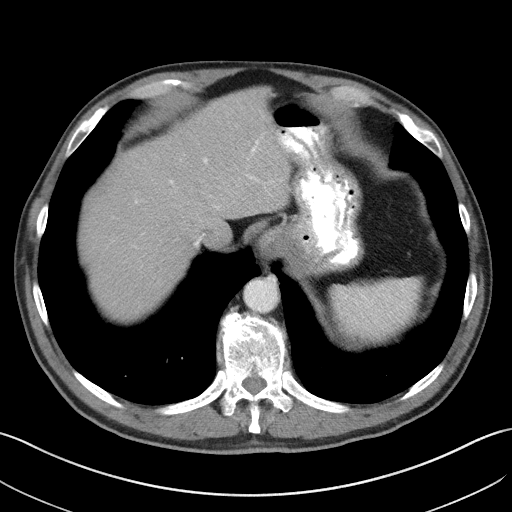
[im 87/93  soft-tissue]
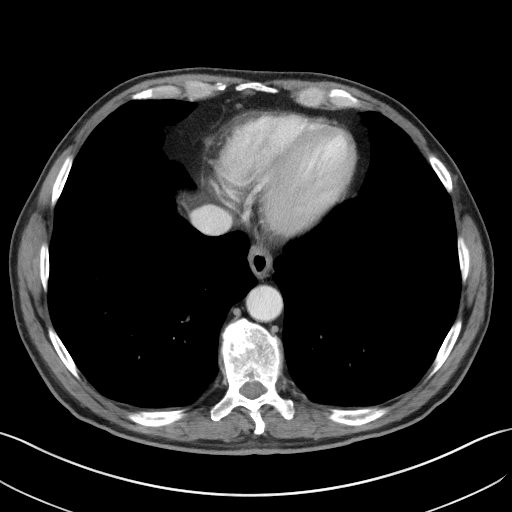

[Series 5: coronal st · coronal · 0.78mm/px · 3 of 104 slices shown]
[im 35/104  soft-tissue]
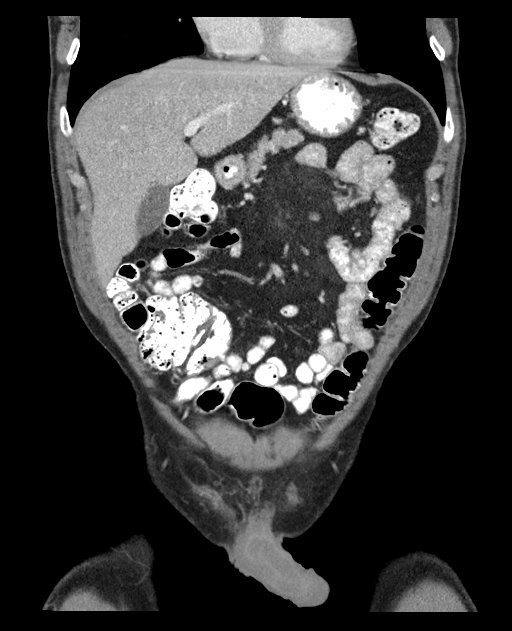
[im 46/104  soft-tissue]
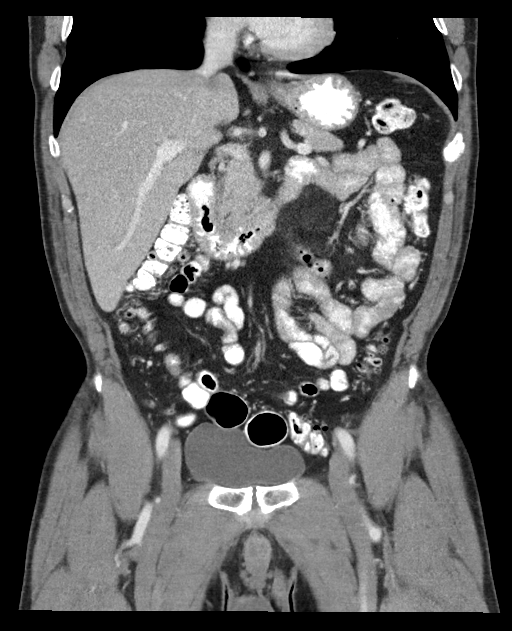
[im 58/104  soft-tissue]
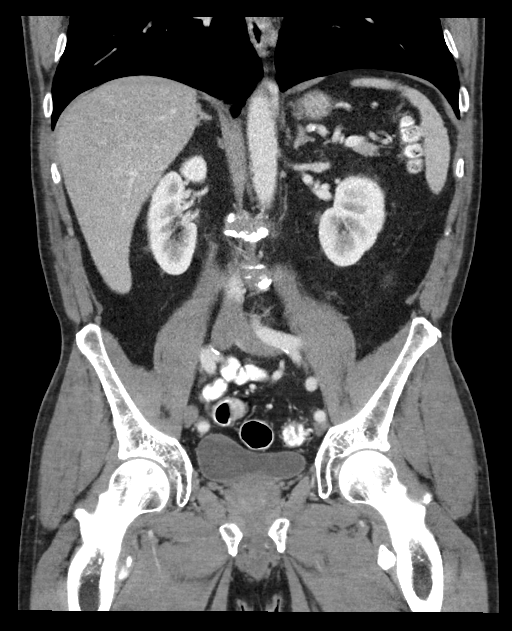

[16 of 46 positions shown; findings below may reference images not displayed]

FINDINGS: Lower chest: Lung bases are clear.

Hepatobiliary: No focal hepatic lesion. No biliary duct dilatation.
Gallbladder is normal. Common bile duct is normal.

Pancreas: Pancreas is normal. No ductal dilatation. No pancreatic
inflammation.

Spleen: Normal spleen

Adrenals/urinary tract: Adrenal glands and kidneys are normal. The
ureters and bladder normal.

Stomach/Bowel: Stomach, small bowel, appendix, and cecum are
normal.. Multiple diverticula of the descending colon and sigmoid
colon without acute inflammation.

Vascular/Lymphatic: Abdominal aorta is normal caliber with
atherosclerotic calcification. There is no retroperitoneal or
periportal lymphadenopathy. No pelvic lymphadenopathy.

Reproductive: Prostate normal.

Other: No free fluid.

Musculoskeletal: Degenerative osteophytosis of the spine.
IMPRESSION: 1. No acute abdominopelvic findings. No explanation for abdominal
pain.
2.  Aortic Atherosclerosis ([id number]-J02.2).

## 2023-05-08 ENCOUNTER — Other Ambulatory Visit: Payer: Self-pay | Admitting: Infectious Diseases

## 2023-05-08 DIAGNOSIS — R030 Elevated blood-pressure reading, without diagnosis of hypertension: Secondary | ICD-10-CM

## 2023-05-08 DIAGNOSIS — I700 Atherosclerosis of aorta: Secondary | ICD-10-CM

## 2023-05-08 DIAGNOSIS — M19021 Primary osteoarthritis, right elbow: Secondary | ICD-10-CM

## 2023-05-08 DIAGNOSIS — Z0000 Encounter for general adult medical examination without abnormal findings: Secondary | ICD-10-CM

## 2023-05-08 DIAGNOSIS — E785 Hyperlipidemia, unspecified: Secondary | ICD-10-CM

## 2023-05-13 ENCOUNTER — Ambulatory Visit
Admission: RE | Admit: 2023-05-13 | Discharge: 2023-05-13 | Disposition: A | Discharge status: Home / Self Care | Payer: Self-pay | Source: Ambulatory Visit | Attending: Infectious Diseases | Admitting: Infectious Diseases

## 2023-05-13 DIAGNOSIS — Z0000 Encounter for general adult medical examination without abnormal findings: Secondary | ICD-10-CM | POA: Insufficient documentation

## 2023-05-13 DIAGNOSIS — I700 Atherosclerosis of aorta: Secondary | ICD-10-CM | POA: Insufficient documentation

## 2023-05-13 DIAGNOSIS — E785 Hyperlipidemia, unspecified: Secondary | ICD-10-CM | POA: Insufficient documentation

## 2023-05-13 DIAGNOSIS — M19021 Primary osteoarthritis, right elbow: Secondary | ICD-10-CM | POA: Insufficient documentation

## 2023-05-13 DIAGNOSIS — R030 Elevated blood-pressure reading, without diagnosis of hypertension: Secondary | ICD-10-CM | POA: Insufficient documentation
